# Patient Record
Sex: Male | Born: 1980 | Race: Black or African American | Hispanic: No | Marital: Single | Smoking: Current every day smoker
Health system: Southern US, Community
[De-identification: ages and names within clinical notes are randomized; demographics above are authoritative.]

## PROBLEM LIST (undated history)

## (undated) DIAGNOSIS — R569 Unspecified convulsions: Secondary | ICD-10-CM

---

## 2015-08-24 ENCOUNTER — Emergency Department (HOSPITAL_BASED_OUTPATIENT_CLINIC_OR_DEPARTMENT_OTHER)
Admission: EM | Admit: 2015-08-24 | Discharge: 2015-08-25 | Disposition: A | Discharge status: Home / Self Care | Payer: Medicaid Other | Attending: Emergency Medicine | Admitting: Emergency Medicine

## 2015-08-24 ENCOUNTER — Encounter (HOSPITAL_BASED_OUTPATIENT_CLINIC_OR_DEPARTMENT_OTHER): Payer: Self-pay

## 2015-08-24 DIAGNOSIS — L0291 Cutaneous abscess, unspecified: Secondary | ICD-10-CM

## 2015-08-24 DIAGNOSIS — L0201 Cutaneous abscess of face: Secondary | ICD-10-CM | POA: Insufficient documentation

## 2015-08-24 DIAGNOSIS — L0390 Cellulitis, unspecified: Secondary | ICD-10-CM

## 2015-08-24 DIAGNOSIS — L03211 Cellulitis of face: Secondary | ICD-10-CM | POA: Insufficient documentation

## 2015-08-24 HISTORY — DX: Unspecified convulsions: R56.9

## 2015-08-24 MED ORDER — LIDOCAINE HCL 2 % IJ SOLN
INTRAMUSCULAR | Status: AC
  Filled 2015-08-24: qty 20

## 2015-08-24 NOTE — ED Notes (Signed)
Pt c/o right side jaw abscess for the last three days with lower back pain

## 2015-08-25 MED ORDER — SULFAMETHOXAZOLE-TRIMETHOPRIM 800-160 MG PO TABS
1.0000 | ORAL_TABLET | Freq: Once | ORAL | Status: AC
  Administered 2015-08-25: 1 via ORAL

## 2015-08-25 MED ORDER — SULFAMETHOXAZOLE-TRIMETHOPRIM 800-160 MG PO TABS
ORAL_TABLET | ORAL | Status: AC
  Filled 2015-08-25: qty 1

## 2015-08-25 MED ORDER — HYDROCODONE-ACETAMINOPHEN 5-325 MG PO TABS
2.0000 | ORAL_TABLET | Freq: Once | ORAL | Status: AC
  Administered 2015-08-25: 2 via ORAL
  Filled 2015-08-25: qty 2

## 2015-08-25 MED ORDER — HYDROCODONE-ACETAMINOPHEN 5-325 MG PO TABS: 1.0000 | ORAL_TABLET | Freq: Four times a day (QID) | ORAL | Status: DC | PRN

## 2015-08-25 MED ORDER — SULFAMETHOXAZOLE-TRIMETHOPRIM 800-160 MG PO TABS
1.0000 | ORAL_TABLET | Freq: Two times a day (BID) | ORAL | Status: AC
Start: 2015-08-25 — End: 2015-09-01

## 2015-08-25 NOTE — ED Notes (Signed)
Pt verbalizes understanding of d/c instructions and denies any further needs at this time. 

## 2015-08-25 NOTE — ED Provider Notes (Signed)
TIME SEEN: 12:45 AM  CHIEF COMPLAINT: Right-sided facial abscess  HPI: Pt is a 35 y.o. male with history of seizures who presents to the emergency department with right-sided abscess at the angle of his jaw for the past 3 days. States it started off as a small "temple" that was draining. It is now no longer draining and has grown in size and is painful. No fevers or chills. No nausea, vomiting or diarrhea. He is not diabetic or immunocompromised. Has never had an abscess before. No dental pain. No swelling underneath his jaw.  ROS: See HPI Constitutional: no fever  Eyes: no drainage  ENT: no runny nose   Cardiovascular:  no chest pain  Resp: no SOB  GI: no vomiting GU: no dysuria Integumentary: no rash  Allergy: no hives  Musculoskeletal: no leg swelling  Neurological: no slurred speech ROS otherwise negative  PAST MEDICAL HISTORY/PAST SURGICAL HISTORY:  Past Medical History  Diagnosis Date  . Seizures (HCC)     MEDICATIONS:  Prior to Admission medications   Medication Sig Start Date End Date Taking? Authorizing Provider  UNKNOWN TO PATIENT    Yes Historical Provider, MD    ALLERGIES:  No Known Allergies  SOCIAL HISTORY:  Social History  Substance Use Topics  . Smoking status: Not on file  . Smokeless tobacco: Not on file  . Alcohol Use: Not on file    FAMILY HISTORY: No family history on file.  EXAM: BP 133/61 mmHg  Pulse 86  Temp(Src) 98.6 F (37 C) (Oral)  Resp 18  Ht  (1.753 m)  Wt 210 lb (95.255 kg)  BMI 31.00 kg/m2  SpO2 100% CONSTITUTIONAL: Alert and oriented and responds appropriately to questions. Well-appearing; well-nourished, afebrile nontoxic HEAD: Normocephalic EYES: Conjunctivae clear, PERRL ENT: normal nose; no rhinorrhea; moist mucous membranes; No pharyngeal erythema or petechiae, no tonsillar hypertrophy or exudate, no uvular deviation, no trismus or drooling, normal phonation, no stridor, no dental caries or abscess noted, no  Ludwig's angina, tongue sits flat in the bottom of the mouth; patient has a 3 x 4 cm swollen slightly fluctuant and indurated area with erythema to the angle of the right mandible with no active drainage NECK: Supple, no meningismus, no LAD  CARD: RRR; S1 and S2 appreciated; no murmurs, no clicks, no rubs, no gallops RESP: Normal chest excursion without splinting or tachypnea; breath sounds clear and equal bilaterally; no wheezes, no rhonchi, no rales, no hypoxia or respiratory distress, speaking full sentences ABD/GI: Normal bowel sounds; non-distended; soft, non-tender, no rebound, no guarding, no peritoneal signs BACK:  The back appears normal and is non-tender to palpation, there is no CVA tenderness EXT: Normal ROM in all joints; non-tender to palpation; no edema; normal capillary refill; no cyanosis, no calf tenderness or swelling    SKIN: Normal color for age and race; warm; no rash NEURO: Moves all extremities equally, sensation to light touch intact diffusely, cranial nerves II through XII intact PSYCH: The patient's mood and manner are appropriate. Grooming and personal hygiene are appropriate.  MEDICAL DECISION MAKING: Patient here with an abscess to the right jaw. I have was able to incise and drain purulent material from this abscess. Given he does have some signs of cellulitis will discharge on Bactrim. We'll discharge with pain medication. No sign of Ludwig's angina. Otherwise well-appearing, nontoxic. He is not immunocompromised. Discussed return precautions. Patient verbalizes understanding and is comfortable with this plan.     INCISION AND DRAINAGE Performed by: Rochele Raring  N Consent: Verbal consent obtained. Risks and benefits: risks, benefits and alternatives were discussed Type: abscess  Body area: Right face  Anesthesia: local infiltration  Incision was made with a scalpel.  Local anesthetic: lidocaine 1 % without epinephrine  Anesthetic total: 5  ml  Complexity: complex Blunt dissection to break up loculations  Drainage: purulent  Drainage amount: Small   Packing material: None   Patient tolerance: Patient tolerated the procedure well with no immediate complications.     Layla Maw Zeinab Rodwell, DO 08/25/15 [phone]

## 2015-08-25 NOTE — Discharge Instructions (Signed)
Cellulitis °Cellulitis is an infection of the skin and the tissue beneath it. The infected area is usually red and tender. Cellulitis occurs most often in the arms and lower legs.  °CAUSES  °Cellulitis is caused by bacteria that enter the skin through cracks or cuts in the skin. The most common types of bacteria that cause cellulitis are staphylococci and streptococci. °SIGNS AND SYMPTOMS  °· Redness and warmth. °· Swelling. °· Tenderness or pain. °· Fever. °DIAGNOSIS  °Your health care provider can usually determine what is wrong based on a physical exam. Blood tests may also be done. °TREATMENT  °Treatment usually involves taking an antibiotic medicine. °HOME CARE INSTRUCTIONS  °· Take your antibiotic medicine as directed by your health care provider. Finish the antibiotic even if you start to feel better. °· Keep the infected arm or leg elevated to reduce swelling. °· Apply a warm cloth to the affected area up to 4 times per day to relieve pain. °· Take medicines only as directed by your health care provider. °· Keep all follow-up visits as directed by your health care provider. °SEEK MEDICAL CARE IF:  °· You notice red streaks coming from the infected area. °· Your red area gets larger or turns dark in color. °· Your bone or joint underneath the infected area becomes painful after the skin has healed. °· Your infection returns in the same area or another area. °· You notice a swollen bump in the infected area. °· You develop new symptoms. °· You have a fever. °SEEK IMMEDIATE MEDICAL CARE IF:  °· You feel very sleepy. °· You develop vomiting or diarrhea. °· You have a general ill feeling (malaise) with muscle aches and pains. °  °This information is not intended to replace advice given to you by your health care provider. Make sure you discuss any questions you have with your health care provider. °  °Document Released: 03/22/2005 Document Revised: 03/03/2015 Document Reviewed: 08/28/2011 °Elsevier Interactive  Patient Education ©2016 [employer] ° °Abscess °An abscess is an infected area that contains a collection of pus and debris. It can occur in almost any part of the body. An abscess is also known as a furuncle or boil. °CAUSES  °An abscess occurs when tissue gets infected. This can occur from blockage of oil or sweat glands, infection of hair follicles, or a minor injury to the skin. As the body tries to fight the infection, pus collects in the area and creates pressure under the skin. This pressure causes pain. People with weakened immune systems have difficulty fighting infections and get certain abscesses more often.  °SYMPTOMS °Usually an abscess develops on the skin and becomes a painful mass that is red, warm, and tender. If the abscess forms under the skin, you may feel a moveable soft area under the skin. Some abscesses break open (rupture) on their own, but most will continue to get worse without care. The infection can spread deeper into the body and eventually into the bloodstream, causing you to feel ill.  °DIAGNOSIS  °Your caregiver will take your medical history and perform a physical exam. A sample of fluid may also be taken from the abscess to determine what is causing your infection. °TREATMENT  °Your caregiver may prescribe antibiotic medicines to fight the infection. However, taking antibiotics alone usually does not cure an abscess. Your caregiver may need to make a small cut (incision) in the abscess to drain the pus. In some cases, gauze is packed into the abscess to reduce   pain and to continue draining the area. °HOME CARE INSTRUCTIONS  °· Only take over-the-counter or prescription medicines for pain, discomfort, or fever as directed by your caregiver. °· If you were prescribed antibiotics, take them as directed. Finish them even if you start to feel better. °· If gauze is used, follow your caregiver's directions for changing the gauze. °· To avoid spreading the infection: °¨ Keep your  draining abscess covered with a bandage. °¨ Wash your hands well. °¨ Do not share personal care items, towels, or whirlpools with others. °¨ Avoid skin contact with others. °· Keep your skin and clothes clean around the abscess. °· Keep all follow-up appointments as directed by your caregiver. °SEEK MEDICAL CARE IF:  °· You have increased pain, swelling, redness, fluid drainage, or bleeding. °· You have muscle aches, chills, or a general ill feeling. °· You have a fever. °MAKE SURE YOU:  °· Understand these instructions. °· Will watch your condition. °· Will get help right away if you are not doing well or get worse. °  °This information is not intended to replace advice given to you by your health care provider. Make sure you discuss any questions you have with your health care provider. °  °Document Released: 03/22/2005 Document Revised: 12/12/2011 Document Reviewed: 08/25/2011 °Elsevier Interactive Patient Education ©2016 [employer] ° °

## 2016-03-27 ENCOUNTER — Emergency Department (HOSPITAL_BASED_OUTPATIENT_CLINIC_OR_DEPARTMENT_OTHER)
Admission: EM | Admit: 2016-03-27 | Discharge: 2016-03-28 | Disposition: A | Discharge status: Home / Self Care | Payer: Medicaid Other | Attending: Emergency Medicine | Admitting: Emergency Medicine

## 2016-03-27 ENCOUNTER — Encounter (HOSPITAL_BASED_OUTPATIENT_CLINIC_OR_DEPARTMENT_OTHER): Payer: Self-pay | Admitting: Emergency Medicine

## 2016-03-27 DIAGNOSIS — S0502XA Injury of conjunctiva and corneal abrasion without foreign body, left eye, initial encounter: Secondary | ICD-10-CM | POA: Insufficient documentation

## 2016-03-27 DIAGNOSIS — Y9389 Activity, other specified: Secondary | ICD-10-CM | POA: Insufficient documentation

## 2016-03-27 DIAGNOSIS — Y990 Civilian activity done for income or pay: Secondary | ICD-10-CM | POA: Diagnosis not present

## 2016-03-27 DIAGNOSIS — X58XXXA Exposure to other specified factors, initial encounter: Secondary | ICD-10-CM | POA: Diagnosis not present

## 2016-03-27 DIAGNOSIS — S0592XA Unspecified injury of left eye and orbit, initial encounter: Secondary | ICD-10-CM | POA: Diagnosis present

## 2016-03-27 DIAGNOSIS — Y929 Unspecified place or not applicable: Secondary | ICD-10-CM | POA: Insufficient documentation

## 2016-03-27 DIAGNOSIS — F17200 Nicotine dependence, unspecified, uncomplicated: Secondary | ICD-10-CM | POA: Diagnosis not present

## 2016-03-27 DIAGNOSIS — Z79899 Other long term (current) drug therapy: Secondary | ICD-10-CM | POA: Insufficient documentation

## 2016-03-27 NOTE — ED Triage Notes (Signed)
Left eye pain x 2 days

## 2016-03-28 ENCOUNTER — Encounter (HOSPITAL_BASED_OUTPATIENT_CLINIC_OR_DEPARTMENT_OTHER): Payer: Self-pay | Admitting: Emergency Medicine

## 2016-03-28 MED ORDER — TETRACAINE HCL 0.5 % OP SOLN
OPHTHALMIC | Status: AC
  Administered 2016-03-28: 01:00:00
  Filled 2016-03-28: qty 4

## 2016-03-28 MED ORDER — IBUPROFEN 800 MG PO TABS
800.0000 mg | ORAL_TABLET | Freq: Once | ORAL | Status: AC
  Administered 2016-03-28: 800 mg via ORAL
  Filled 2016-03-28: qty 1

## 2016-03-28 MED ORDER — FLUORESCEIN SODIUM 1 MG OP STRP
ORAL_STRIP | OPHTHALMIC | Status: AC
  Administered 2016-03-28: 01:00:00
  Filled 2016-03-28: qty 1

## 2016-03-28 MED ORDER — TOBRAMYCIN-DEXAMETHASONE 0.3-0.1 % OP SUSP: 2.0000 [drp] | Freq: Four times a day (QID) | OPHTHALMIC | 0 refills | Status: DC

## 2016-03-28 MED ORDER — IBUPROFEN 800 MG PO TABS: 800.0000 mg | ORAL_TABLET | Freq: Three times a day (TID) | ORAL | 0 refills | Status: DC

## 2016-03-28 NOTE — ED Provider Notes (Signed)
[facility] Note   CSN: [id number] Arrival date & time: 03/27/16  2304   By signing my name below, I, Teofilo Pod, attest that this documentation has been prepared under the direction and in the presence of Nawaf Strange, MD . Electronically Signed: Teofilo Pod, ED Scribe. 03/28/2016. 12:31 AM.   History   Chief Complaint Chief Complaint  Patient presents with  . Eye Pain    The history is provided by the patient. No language interpreter was used.  Eye Pain  This is a new problem. The current episode started 2 days ago. The problem occurs constantly. The problem has not changed since onset.Pertinent negatives include no chest pain, no abdominal pain, no headaches and no shortness of breath. Nothing aggravates the symptoms. Nothing relieves the symptoms. He has tried water for the symptoms. The treatment provided no (lead to blurred vision) relief.   HPI Comments:  Nathan Maxwell is a 35 y.o. male who presents to the Emergency Department complaining of constant L eye pain x2 days. Pt states that he was working and some wood debris flew in to his L eye, was not wearing glasses. Pt reports some associated tears. Pt rinsed his eye with water which led to blurred vision. Last tetanus was 3 years ago. Pt denies redness.   Past Medical History:  Diagnosis Date  . Seizures (HCC)     There are no active problems to display for this patient.   History reviewed. No pertinent surgical history.     Home Medications    Prior to Admission medications   Medication Sig Start Date End Date Taking? Authorizing Provider  carbamazepine (TEGRETOL) 200 MG tablet Take 200 mg by mouth 3 (three) times daily.   Yes Historical Provider, MD  HYDROcodone-acetaminophen (NORCO/VICODIN) 5-325 MG tablet Take 1-2 tablets by mouth every 6 (six) hours as needed. 08/25/15   Kristen N Ward, DO  UNKNOWN TO PATIENT     Historical Provider, MD    Family History History reviewed.  No pertinent family history.  Social History Social History  Substance Use Topics  . Smoking status: Current Every Day Smoker  . Smokeless tobacco: Never Used  . Alcohol use Yes     Comment: occ     Allergies   Review of patient's allergies indicates no known allergies.   Review of Systems Review of Systems  Constitutional: Negative for fever.  Eyes: Positive for pain. Negative for photophobia, discharge, redness, itching and visual disturbance.  Respiratory: Negative for shortness of breath.   Cardiovascular: Negative for chest pain.  Gastrointestinal: Negative for abdominal pain.  Neurological: Negative for headaches.  All other systems reviewed and are negative.    Physical Exam Updated Vital Signs BP 120/64 (BP Location: Right Arm)   Pulse 68   Temp 98.3 F (36.8 C) (Oral)   Resp 18   Ht 5\' 10"  (1.778 m)   Wt 210 lb (95.3 kg)   SpO2 100%   BMI 30.13 kg/m   Physical Exam  Constitutional: He is oriented to person, place, and time. He appears well-developed and well-nourished. No distress.  HENT:  Head: Normocephalic and atraumatic.  Mouth/Throat: Oropharynx is clear and moist. No oropharyngeal exudate.  Trachea midline  Eyes: Conjunctivae, EOM and lids are normal. Pupils are equal, round, and reactive to light. Lids are everted and swept, no foreign bodies found. Right eye exhibits no chemosis, no discharge and no exudate. No foreign body present in the right eye. Left eye exhibits  no chemosis, no discharge and no exudate. No foreign body present in the left eye. Right conjunctiva is not injected. Left conjunctiva is not injected. Right eye exhibits normal extraocular motion and no nystagmus. Left eye exhibits normal extraocular motion and no nystagmus.  Slit lamp exam:      The left eye shows corneal abrasion (9 to 12 oclock position). The left eye shows no corneal flare, no corneal ulcer, no foreign body, no hyphema, no hypopyon and no anterior chamber bulge.  No  leakage. Lids inverted and swept.   Neck: Trachea normal and normal range of motion. Neck supple. No JVD present. Carotid bruit is not present. No tracheal deviation present.  No bruits  Cardiovascular: Normal rate, regular rhythm, normal heart sounds and intact distal pulses.  Exam reveals no gallop and no friction rub.   No murmur heard. Pulmonary/Chest: Effort normal and breath sounds normal. No stridor. He has no wheezes. He has no rales.  Abdominal: Soft. Bowel sounds are normal. He exhibits no mass. There is no tenderness. There is no rebound and no guarding.  Musculoskeletal: Normal range of motion.  Lymphadenopathy:    He has no cervical adenopathy.  Neurological: He is alert and oriented to person, place, and time. He has normal reflexes. He displays normal reflexes. No cranial nerve deficit. He exhibits normal muscle tone. Coordination normal.  Skin: Skin is warm and dry. Capillary refill takes less than 2 seconds. He is not diaphoretic.  Psychiatric: He has a normal mood and affect. His behavior is normal.    ED Treatments / Results  DIAGNOSTIC STUDIES:  Oxygen Saturation is 100% on RA, normal by my interpretation.    COORDINATION OF CARE:  12:31 AM Discussed treatment plan with pt at bedside and pt agreed to plan.  Vitals:   03/27/16 2313  BP: 120/64  Pulse: 68  Resp: 18  Temp: 98.3 F (36.8 C)   Medications  fluorescein 1 MG ophthalmic strip (not administered)  tetracaine (PONTOCAINE) 0.5 % ophthalmic solution (not administered)  ibuprofen (ADVIL,MOTRIN) tablet 800 mg (not administered)     Visual Acuity  Right Eye Distance: 20/50 Left Eye Distance: 20/25 Bilateral Distance: 20/25  Right Eye Near:   Left Eye Near:    Bilateral Near:    Radiology No results found.  Procedures Procedures (including critical care time)  Medications Ordered in ED Medications  fluorescein 1 MG ophthalmic strip (not administered)  tetracaine (PONTOCAINE) 0.5 % ophthalmic  solution (not administered)  ibuprofen (ADVIL,MOTRIN) tablet 800 mg (not administered)     Initial Impression / Assessment and Plan / ED Course  I have reviewed the triage vital signs and the nursing notes.  Pertinent labs & imaging results that were available during my care of the patient were reviewed by me and considered in my medical decision making (see chart for details).  Clinical Course    Final Clinical Impressions(s) / ED Diagnoses   Final diagnoses:  None   Antibiotic drops.  Follow up with ophthalmology in 2 days.  Strict return precautions given.  All questions answered to patient's satisfaction. Based on history and exam patient has been appropriately medically screened and emergency conditions excluded. Patient is stable for discharge at this time. Follow up with your PMD for recheck in 2 days and strict return precautions given New Prescriptions New Prescriptions   No medications on file  I personally performed the services described in this documentation, which was scribed in my presence. The recorded information has been reviewed  and is accurate.       Cy BlamerApril Forrester Blando, MD 03/28/16 [phone]

## 2017-07-11 ENCOUNTER — Emergency Department (HOSPITAL_BASED_OUTPATIENT_CLINIC_OR_DEPARTMENT_OTHER)
Admission: EM | Admit: 2017-07-11 | Discharge: 2017-07-12 | Disposition: A | Discharge status: Home / Self Care | Payer: Medicaid Other | Attending: Emergency Medicine | Admitting: Emergency Medicine

## 2017-07-11 ENCOUNTER — Emergency Department (HOSPITAL_BASED_OUTPATIENT_CLINIC_OR_DEPARTMENT_OTHER): Payer: Medicaid Other

## 2017-07-11 ENCOUNTER — Other Ambulatory Visit: Payer: Self-pay

## 2017-07-11 ENCOUNTER — Encounter (HOSPITAL_BASED_OUTPATIENT_CLINIC_OR_DEPARTMENT_OTHER): Payer: Self-pay

## 2017-07-11 DIAGNOSIS — M25531 Pain in right wrist: Secondary | ICD-10-CM | POA: Insufficient documentation

## 2017-07-11 DIAGNOSIS — M791 Myalgia, unspecified site: Secondary | ICD-10-CM | POA: Diagnosis not present

## 2017-07-11 DIAGNOSIS — Y998 Other external cause status: Secondary | ICD-10-CM | POA: Diagnosis not present

## 2017-07-11 DIAGNOSIS — Y939 Activity, unspecified: Secondary | ICD-10-CM | POA: Insufficient documentation

## 2017-07-11 DIAGNOSIS — F17210 Nicotine dependence, cigarettes, uncomplicated: Secondary | ICD-10-CM | POA: Insufficient documentation

## 2017-07-11 DIAGNOSIS — Y92410 Unspecified street and highway as the place of occurrence of the external cause: Secondary | ICD-10-CM | POA: Insufficient documentation

## 2017-07-11 MED ORDER — NAPROXEN 250 MG PO TABS
500.0000 mg | ORAL_TABLET | Freq: Once | ORAL | Status: AC
  Administered 2017-07-11: 500 mg via ORAL
  Filled 2017-07-11: qty 2

## 2017-07-11 MED ORDER — ACETAMINOPHEN 500 MG PO TABS
1000.0000 mg | ORAL_TABLET | Freq: Once | ORAL | Status: AC
  Administered 2017-07-11: 1000 mg via ORAL
  Filled 2017-07-11: qty 2
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
## 2017-07-12 MED ORDER — NAPROXEN 500 MG PO TABS: 500.0000 mg | ORAL_TABLET | Freq: Two times a day (BID) | ORAL | 0 refills | Status: DC | PRN

## 2017-07-12 MED ORDER — METHOCARBAMOL 500 MG PO TABS: 500.0000 mg | ORAL_TABLET | Freq: Two times a day (BID) | ORAL | 0 refills | Status: DC | PRN

## 2017-07-12 NOTE — ED Notes (Signed)
Pt verbalizes understanding of d/c instructions and denies any further needs at this time. 
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
## 2019-10-20 ENCOUNTER — Emergency Department (HOSPITAL_BASED_OUTPATIENT_CLINIC_OR_DEPARTMENT_OTHER): Payer: Medicaid Other

## 2019-10-20 ENCOUNTER — Encounter (HOSPITAL_BASED_OUTPATIENT_CLINIC_OR_DEPARTMENT_OTHER): Payer: Self-pay | Admitting: Emergency Medicine

## 2019-10-20 ENCOUNTER — Emergency Department (HOSPITAL_BASED_OUTPATIENT_CLINIC_OR_DEPARTMENT_OTHER)
Admission: EM | Admit: 2019-10-20 | Discharge: 2019-10-20 | Disposition: A | Discharge status: Home / Self Care | Payer: Medicaid Other | Attending: Emergency Medicine | Admitting: Emergency Medicine

## 2019-10-20 ENCOUNTER — Other Ambulatory Visit: Payer: Self-pay

## 2019-10-20 DIAGNOSIS — Z79899 Other long term (current) drug therapy: Secondary | ICD-10-CM | POA: Insufficient documentation

## 2019-10-20 DIAGNOSIS — F17210 Nicotine dependence, cigarettes, uncomplicated: Secondary | ICD-10-CM | POA: Insufficient documentation

## 2019-10-20 DIAGNOSIS — R109 Unspecified abdominal pain: Secondary | ICD-10-CM | POA: Diagnosis present

## 2019-10-20 DIAGNOSIS — Z791 Long term (current) use of non-steroidal anti-inflammatories (NSAID): Secondary | ICD-10-CM | POA: Insufficient documentation

## 2019-10-20 DIAGNOSIS — R300 Dysuria: Secondary | ICD-10-CM | POA: Diagnosis not present

## 2019-10-20 LAB — CBC
HCT: 46 % (ref 39.0–52.0)
Hemoglobin: 16 g/dL (ref 13.0–17.0)
MCH: 32.3 pg (ref 26.0–34.0)
MCHC: 34.8 g/dL (ref 30.0–36.0)
MCV: 92.9 fL (ref 80.0–100.0)
Platelets: 275 10*3/uL (ref 150–400)
RBC: 4.95 MIL/uL (ref 4.22–5.81)
RDW: 12.4 % (ref 11.5–15.5)
WBC: 6.3 10*3/uL (ref 4.0–10.5)
nRBC: 0 % (ref 0.0–0.2)

## 2019-10-20 LAB — COMPREHENSIVE METABOLIC PANEL
ALT: 15 U/L (ref 0–44)
AST: 21 U/L (ref 15–41)
Albumin: 4.7 g/dL (ref 3.5–5.0)
Alkaline Phosphatase: 73 U/L (ref 38–126)
Anion gap: 10 (ref 5–15)
BUN: 14 mg/dL (ref 6–20)
CO2: 27 mmol/L (ref 22–32)
Calcium: 9.8 mg/dL (ref 8.9–10.3)
Chloride: 102 mmol/L (ref 98–111)
Creatinine, Ser: 1.07 mg/dL (ref 0.61–1.24)
GFR calc Af Amer: >60 mL/min (ref >60)
GFR calc non Af Amer: >60 mL/min (ref >60)
Glucose, Bld: 83 mg/dL (ref 70–99)
Potassium: 3.8 mmol/L (ref 3.5–5.1)
Sodium: 139 mmol/L (ref 135–145)
Total Bilirubin: 0.7 mg/dL (ref 0.3–1.2)
Total Protein: 8.3 g/dL — ABNORMAL HIGH (ref 6.5–8.1)

## 2019-10-20 LAB — URINALYSIS, MICROSCOPIC (REFLEX)

## 2019-10-20 LAB — URINALYSIS, ROUTINE W REFLEX MICROSCOPIC
Bilirubin Urine: NEGATIVE
Glucose, UA: NEGATIVE mg/dL
Ketones, ur: NEGATIVE mg/dL
Nitrite: NEGATIVE
Protein, ur: NEGATIVE mg/dL
Specific Gravity, Urine: 1.02 (ref 1.005–1.030)
pH: 5.5 (ref 5.0–8.0)

## 2019-10-20 LAB — LIPASE, BLOOD: Lipase: 48 U/L (ref 11–51)

## 2019-10-20 MED ORDER — KETOROLAC TROMETHAMINE 30 MG/ML IJ SOLN
30.0000 mg | Freq: Once | INTRAMUSCULAR | Status: AC
  Administered 2019-10-20: 30 mg via INTRAVENOUS
  Filled 2019-10-20: qty 1

## 2019-10-20 MED ORDER — METHOCARBAMOL 500 MG PO TABS: 500.0000 mg | ORAL_TABLET | Freq: Two times a day (BID) | ORAL | 0 refills | Status: AC | PRN

## 2019-10-20 MED ORDER — NAPROXEN 500 MG PO TABS: 500.0000 mg | ORAL_TABLET | Freq: Two times a day (BID) | ORAL | 0 refills | Status: AC | PRN

## 2019-10-20 NOTE — ED Notes (Signed)
ED Provider at bedside. 

## 2019-10-20 NOTE — ED Triage Notes (Signed)
Rt flank pain and hurst to void since yesterday ,  Denies d/c from penis also states hurts to take a deep breath

## 2019-10-20 NOTE — ED Provider Notes (Signed)
[facility] EMERGENCY DEPARTMENT Provider Note   CSN: [id number] Arrival date & time: 10/20/19  1551     History Chief Complaint  Patient presents with  . Flank Pain  . Dysuria    Nathan Maxwell is a 39 y.o. male.  He has a history of seizures.  He is complaining of right lower rib pain right flank pain that started yesterday.  Woke up with it. [place] Spaniel it hurts to take a deep breath and hurts when he twists or turns.  He is also noticed some dysuria.  No fevers chills nausea vomiting.  No prior history of same.  No trauma.  The history is provided by the patient.  Flank Pain This is a new problem. The current episode started yesterday. The problem occurs constantly. The problem has not changed since onset.Associated symptoms include abdominal pain. Pertinent negatives include no chest pain, no headaches and no shortness of breath. The symptoms are aggravated by bending and twisting. Nothing relieves the symptoms. He has tried nothing for the symptoms. The treatment provided no relief.  Dysuria Presenting symptoms: dysuria   Presenting symptoms: no penile discharge and no scrotal pain   Context: after urination   Relieved by:  None tried Worsened by:  Nothing Ineffective treatments:  None tried Associated symptoms: abdominal pain and flank pain   Associated symptoms: no fever, no genital lesions, no nausea, no urinary retention and no vomiting        Past Medical History:  Diagnosis Date  . Seizures (HCC)     There are no problems to display for this patient.   History reviewed. No pertinent surgical history.     No family history on file.  Social History   Tobacco Use  . Smoking status: Current Every Day Smoker    Types: Cigarettes  . Smokeless tobacco: Never Used  Substance Use Topics  . Alcohol use: Yes    Comment: occ  . Drug use: Yes    Types: Marijuana    Home Medications Prior to Admission medications   Medication Sig Start Date End Date  Taking? Authorizing Provider  carbamazepine (TEGRETOL) 200 MG tablet Take 200 mg by mouth 3 (three) times daily.    [provider]  methocarbamol (ROBAXIN) 500 MG tablet Take 1 tablet (500 mg total) by mouth 2 (two) times daily as needed for muscle spasms. 07/12/17   Ward, Chase Picket, PA-C  naproxen (NAPROSYN) 500 MG tablet Take 1 tablet (500 mg total) by mouth 2 (two) times daily as needed. 07/12/17   Ward, Chase Picket, PA-C    Allergies    Patient has no known allergies.  Review of Systems   Review of Systems  Constitutional: Negative for fever.  HENT: Negative for sore throat.   Eyes: Negative for visual disturbance.  Respiratory: Negative for shortness of breath.   Cardiovascular: Negative for chest pain.  Gastrointestinal: Positive for abdominal pain. Negative for nausea and vomiting.  Genitourinary: Positive for dysuria and flank pain. Negative for discharge.  Musculoskeletal: Negative for neck pain.  Skin: Negative for rash.  Neurological: Negative for headaches.    Physical Exam Updated Vital Signs BP 118/76   Pulse 84   Temp 99.1 F (37.3 C) (Oral)   Resp 16   SpO2 100%   Physical Exam Vitals and nursing note reviewed.  Constitutional:      Appearance: He is well-developed.  HENT:     Head: Normocephalic and atraumatic.  Eyes:     Conjunctiva/sclera: Conjunctivae normal.  Cardiovascular:     Rate and Rhythm: Normal rate and regular rhythm.     Heart sounds: No murmur.  Pulmonary:     Effort: Pulmonary effort is normal. No respiratory distress.     Breath sounds: Normal breath sounds.  Abdominal:     Palpations: Abdomen is soft.     Tenderness: There is abdominal tenderness.     Comments: There are some right upper quadrant tenderness but more tender on the right lateral ribs.  Musculoskeletal:        General: No deformity or signs of injury. Normal range of motion.     Cervical back: Neck supple.  Skin:    General: Skin is warm and dry.      Capillary Refill: Capillary refill takes less than 2 seconds.  Neurological:     General: No focal deficit present.     Mental Status: He is alert.     Gait: Gait normal.     ED Results / Procedures / Treatments   Labs (all labs ordered are listed, but only abnormal results are displayed) Labs Reviewed  URINALYSIS, ROUTINE W REFLEX MICROSCOPIC - Abnormal; Notable for the following components:      Result Value   Hgb urine dipstick SMALL (*)    Leukocytes,Ua TRACE (*)    All other components within normal limits  URINALYSIS, MICROSCOPIC (REFLEX) - Abnormal; Notable for the following components:   Bacteria, UA FEW (*)    All other components within normal limits  COMPREHENSIVE METABOLIC PANEL - Abnormal; Notable for the following components:   Total Protein 8.3 (*)    All other components within normal limits  LIPASE, BLOOD  CBC  GC/CHLAMYDIA PROBE AMP ([facility]) NOT AT [facility]    EKG None  Radiology DG Chest 2 View  Result Date: 10/20/2019 CLINICAL DATA:  Pt states RT side pain since last night, hurts to place pressure on RT side, pt denies chest pain, Rt flank pain and pain voiding since yesterday, smoker EXAM: CHEST - 2 VIEW COMPARISON:  None. FINDINGS: The heart size and mediastinal contours are within normal limits. There is mild coarsening of the interstitium diffusely and bilaterally. No focal consolidation. No pneumothorax or pleural effusion. There is no acute finding in the visualized skeleton. IMPRESSION: Bilateral bronchitic changes.  Otherwise no acute finding. Electronically Signed   By: Emmaline Kluver M.D.   On: 10/20/2019 16:39   CT Renal Stone Study  Result Date: 10/20/2019 CLINICAL DATA:  Rt flank pain and hurt to void since yesterday EXAM: CT ABDOMEN AND PELVIS WITHOUT CONTRAST TECHNIQUE: Multidetector CT imaging of the abdomen and pelvis was performed following the standard protocol without IV contrast. COMPARISON:  None. FINDINGS: Lower chest: No acute  abnormality. Evaluation of the abdominal viscera is somewhat limited by the lack of IV contrast. Hepatobiliary: No focal liver abnormality is seen. The gallbladder is unremarkable. Pancreas: Unremarkable. No surrounding inflammatory changes. Spleen: Normal in size without focal abnormality. Adrenals/Urinary Tract: Adrenal glands are unremarkable. Kidneys are symmetric in size. No hydronephrosis. No renal calculi. Urinary bladder is unremarkable. Stomach/Bowel: Stomach is within normal limits. Appendix appears normal. No evidence of bowel wall thickening, distention, or inflammatory changes. Vascular/Lymphatic: No enlarged abdominal or pelvic lymph nodes. Reproductive: Prostate is unremarkable. Other: No abdominal wall hernia or abnormality. No abdominopelvic ascites. Musculoskeletal: No acute or significant osseous findings. IMPRESSION: No acute intra-abdominal or intrapelvic pathology on a noncontrast scan. No renal calculi. Electronically Signed   By: Adline Potter.D.  On: 10/20/2019 18:19    Procedures Procedures (including critical care time)  Medications Ordered in ED Medications  ketorolac (TORADOL) 30 MG/ML injection 30 mg (30 mg Intravenous Given 10/20/19 1811)    ED Course  I have reviewed the triage vital signs and the nursing notes.  Pertinent labs & imaging results that were available during my care of the patient were reviewed by me and considered in my medical decision making (see chart for details).  Clinical Course as of Oct 21 954  Mon Oct 20, 2019  1843 Patient's labs other than a few reds and whites in his urinalysis are unremarkable.  CT also does not show any acute findings.  He does not think he has an STD but he is okay that we test and we can call him if there is anything positive.   [MB]    Clinical Course User Index [MB] Hayden Rasmussen, MD   MDM Rules/Calculators/A&P                     This patient complains of right lateral chest right flank pain; this  involves an extensive number of treatment Options and is a complaint that carries with it a high risk of complications and Morbidity. The differential includes musculoskeletal, biliary colic, renal colic, pyelonephritis  I ordered, reviewed and interpreted labs, which included normal white count normal hemoglobin normal chemistries normal renal function normal LFTs normal lipase, urinalysis equivocal with 6-10 reds 6-10 whites few bacteria.  Have added on GC chlamydia to his urine sample I ordered medication Toradol with some improvement in his pain I ordered imaging studies which included chest x-ray and CT KUB and I independently    visualized and interpreted imaging which showed no acute findings  After the interventions stated above, I reevaluated the patient and found patient to remain very comfortable stable vitals.  His work-up so far has been unremarkable.  I recommended to him that he treat this is muscular and to be aware of any worsening or new symptoms that may help identify the cause of his complaints.  Return instructions discussed  Final Clinical Impression(s) / ED Diagnoses Final diagnoses:  Right flank pain  Dysuria    Rx / DC Orders ED Discharge Orders         Ordered    naproxen (NAPROSYN) 500 MG tablet  2 times daily PRN     10/20/19 1845    methocarbamol (ROBAXIN) 500 MG tablet  2 times daily PRN     10/20/19 1845           Hayden Rasmussen, MD 10/21/19 [phone]

## 2019-10-20 NOTE — Discharge Instructions (Signed)
You were seen in the emergency department for some pain in your right flank and lower chest along with some burning with urination.  You had blood work, urinalysis chest x-ray and a CAT scan of your abdomen and pelvis that did not show any obvious explanation for your symptoms.  We are testing you for gonorrhea and chlamydia and will call you if anything is positive.  We are treating you with some anti-inflammatories and muscle relaxants for your symptoms.  Please return to the emergency department if any worsening or concerning symptoms

## 2020-07-13 ENCOUNTER — Encounter (HOSPITAL_BASED_OUTPATIENT_CLINIC_OR_DEPARTMENT_OTHER): Payer: Self-pay | Admitting: *Deleted

## 2020-07-13 ENCOUNTER — Emergency Department (HOSPITAL_BASED_OUTPATIENT_CLINIC_OR_DEPARTMENT_OTHER)
Admission: EM | Admit: 2020-07-13 | Discharge: 2020-07-13 | Disposition: A | Discharge status: Home / Self Care | Payer: Medicaid Other | Attending: Emergency Medicine | Admitting: Emergency Medicine

## 2020-07-13 ENCOUNTER — Other Ambulatory Visit: Payer: Self-pay

## 2020-07-13 DIAGNOSIS — M545 Low back pain, unspecified: Secondary | ICD-10-CM | POA: Insufficient documentation
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
## 2020-10-18 IMAGING — DX DG CHEST 2V
2 series · 2 of 2 positions shown · non-contrast
Comparison: None.

CLINICAL DATA: Pt states RT side pain since last night, hurts to
place pressure on RT side, pt denies chest pain, Rt flank pain and
pain voiding since yesterday, smoker

EXAM:
CHEST - 2 VIEW

[chest pa]
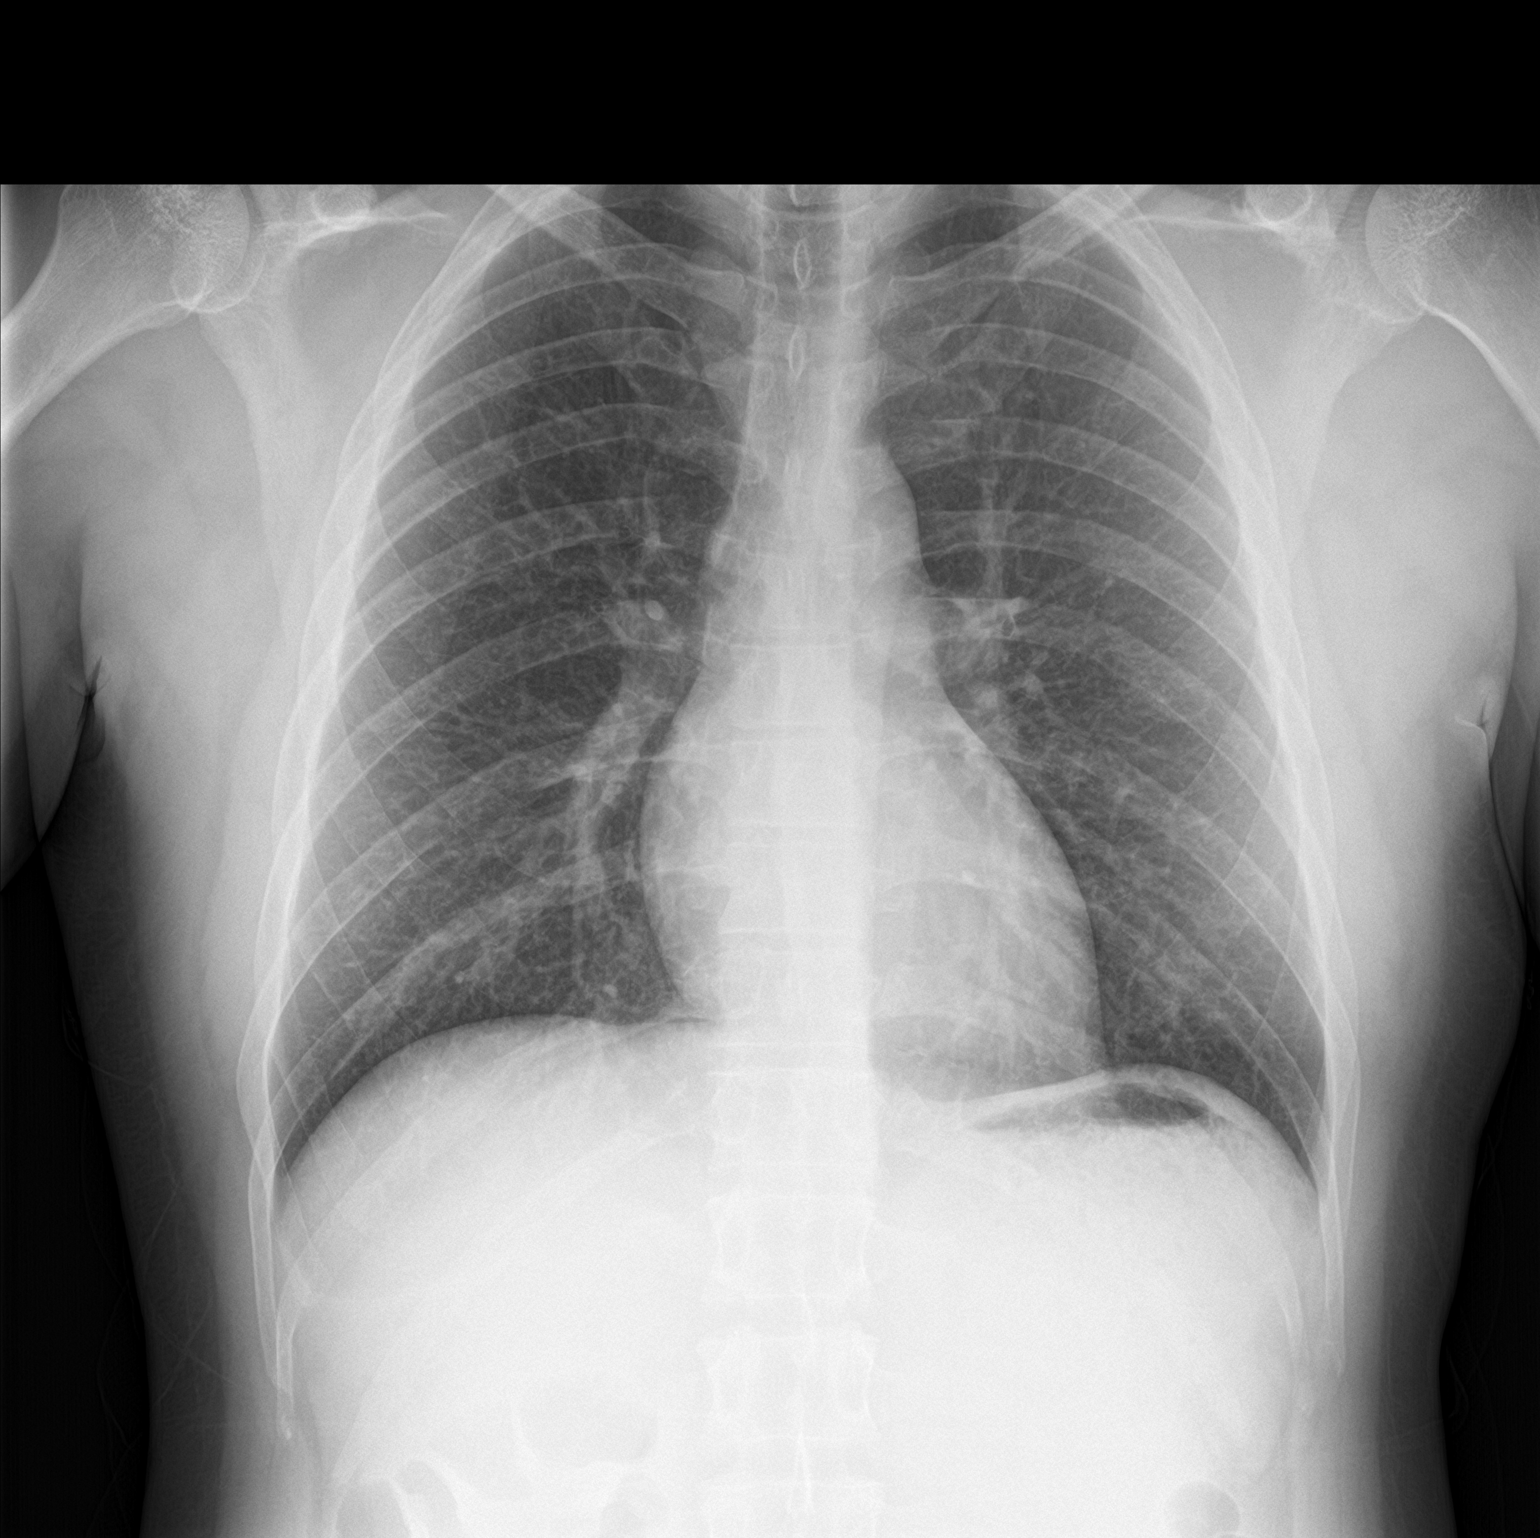

[chest lat]
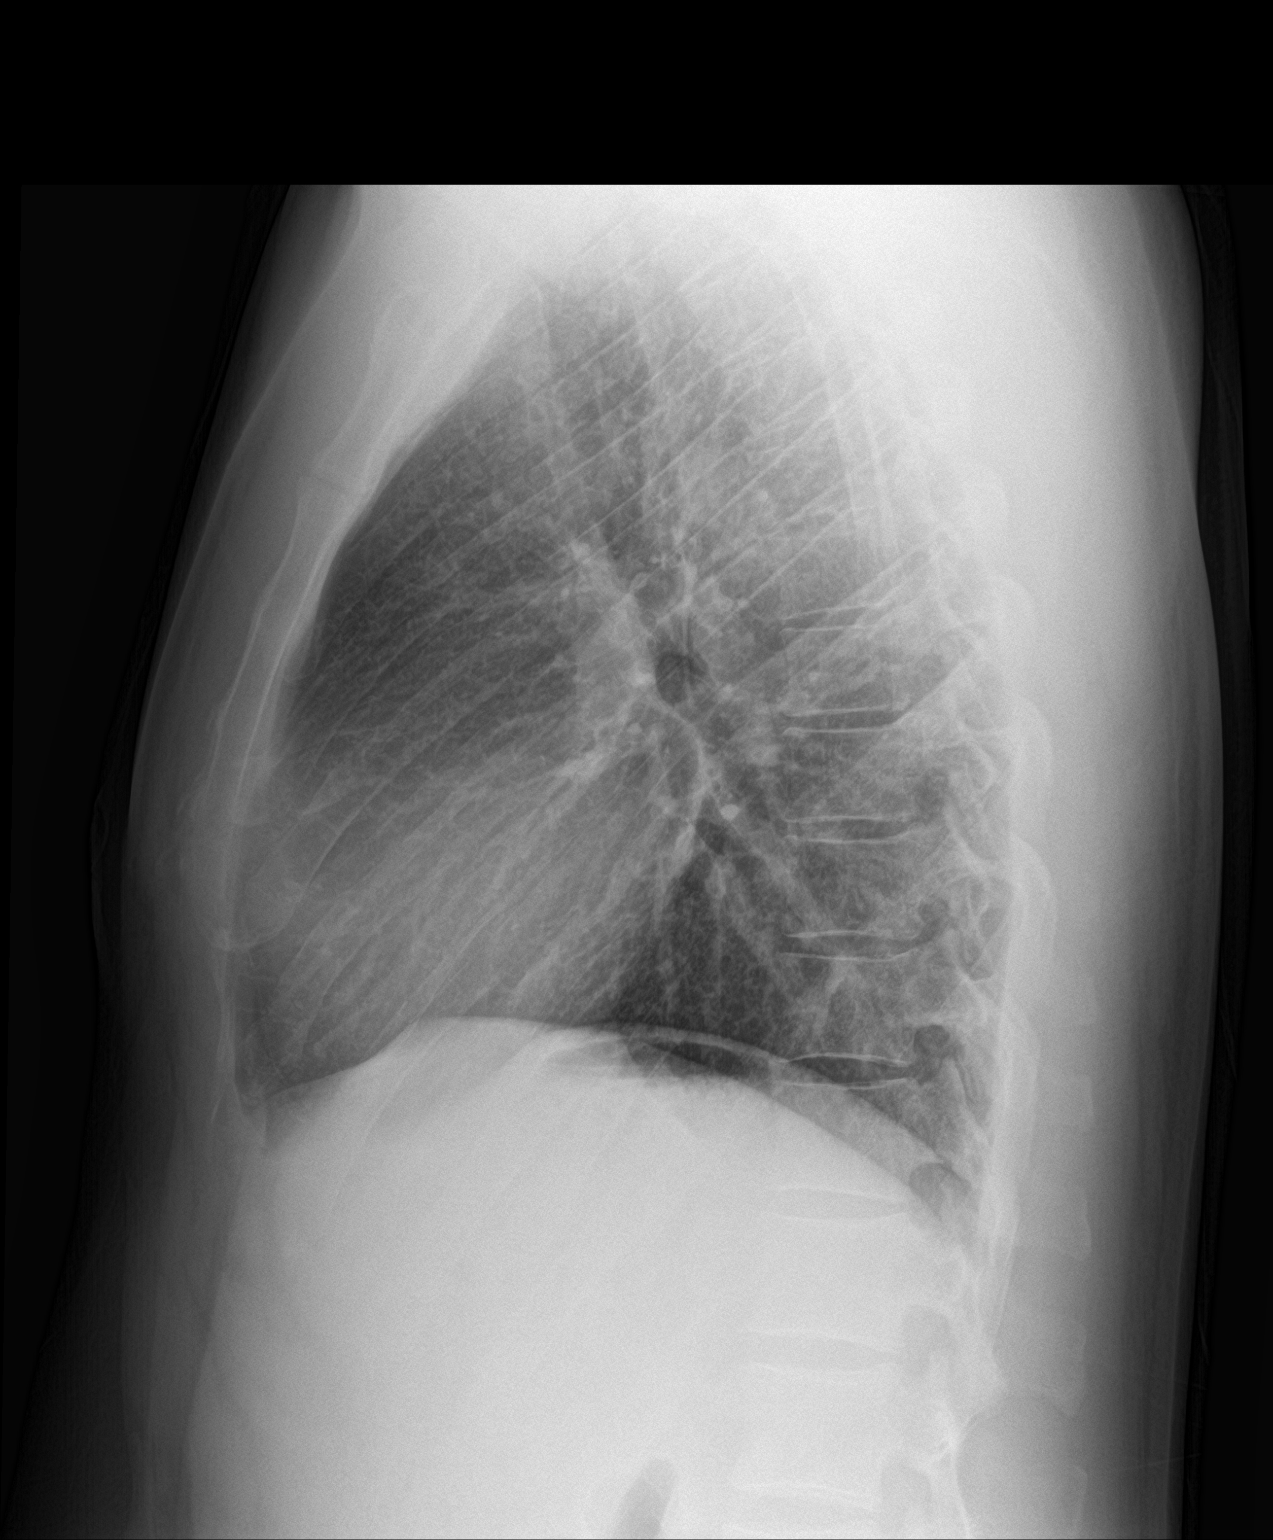

[2 of 2 positions shown; findings below may reference images not displayed]

FINDINGS: The heart size and mediastinal contours are within normal limits.
There is mild coarsening of the interstitium diffusely and
bilaterally. No focal consolidation. No pneumothorax or pleural
effusion. There is no acute finding in the visualized skeleton.
IMPRESSION: Bilateral bronchitic changes.  Otherwise no acute finding.

## 2020-10-18 IMAGING — CT CT RENAL STONE PROTOCOL
2 of 4 series · 17 of 46 positions shown, 19 images · non-contrast
Comparison: None.

CLINICAL DATA: Rt flank pain and hurt to void since yesterday

EXAM:
CT ABDOMEN AND PELVIS WITHOUT CONTRAST
TECHNIQUE: Multidetector CT imaging of the abdomen and pelvis was performed
following the standard protocol without IV contrast.

[Series 2: axial st · axial · 0.66mm/px · z∈[+730,+1125]mm · 14 of 87 slices shown, 16 images]
[im 4/87  soft-tissue]
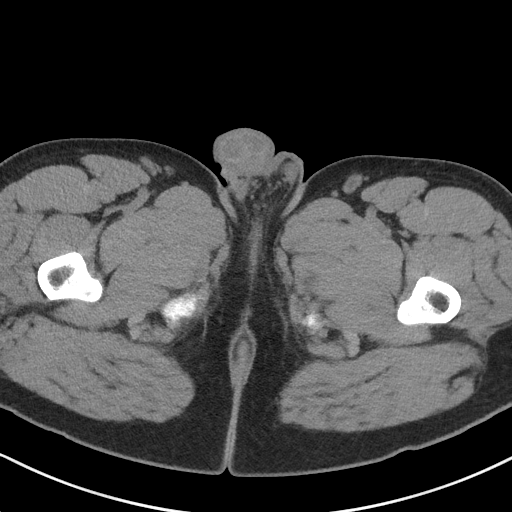
[im 4/87  bone]
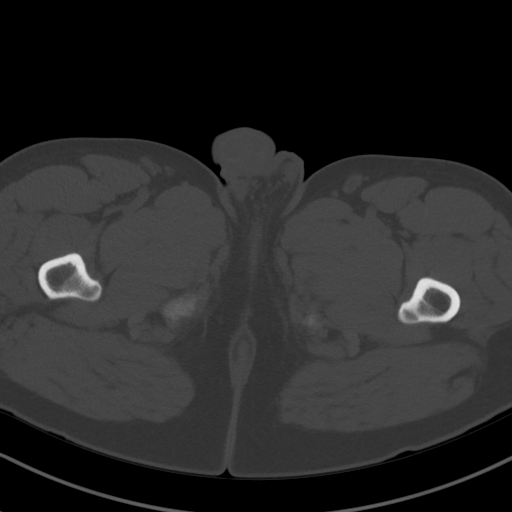
[im 11/87  soft-tissue]
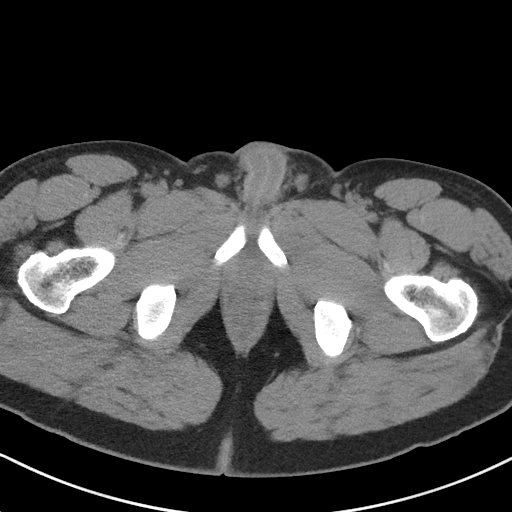
[im 18/87  soft-tissue]
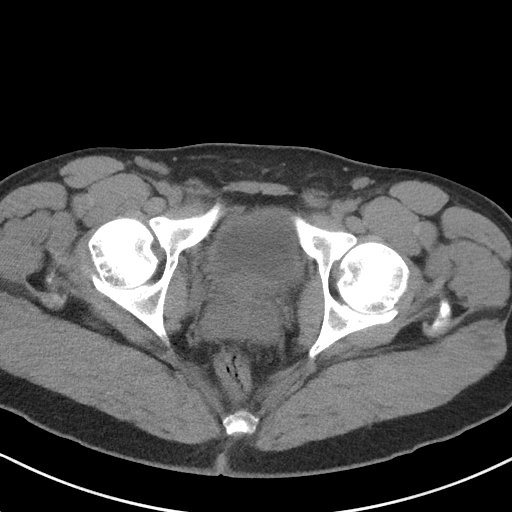
[im 25/87  soft-tissue]
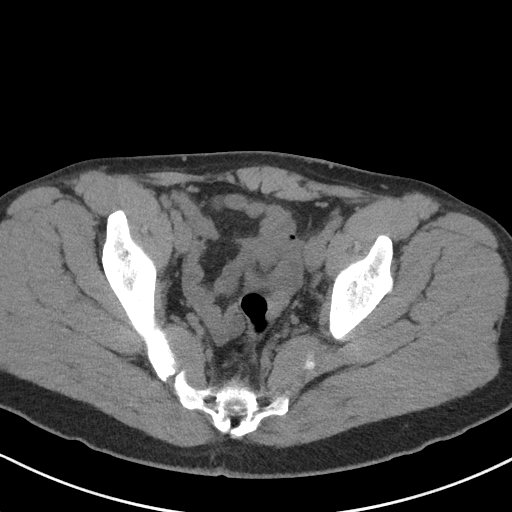
[im 28/87  soft-tissue]
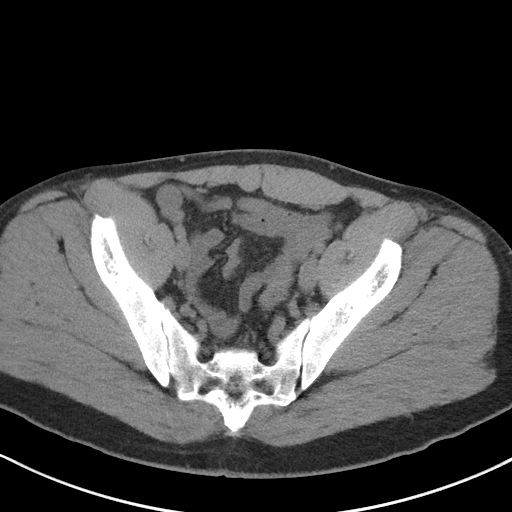
[im 35/87  soft-tissue]
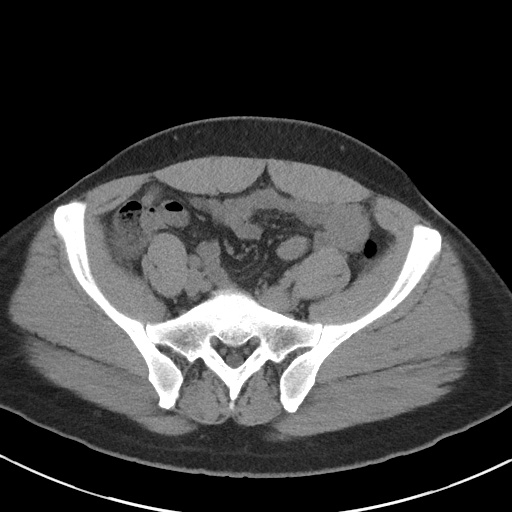
[im 42/87  soft-tissue]
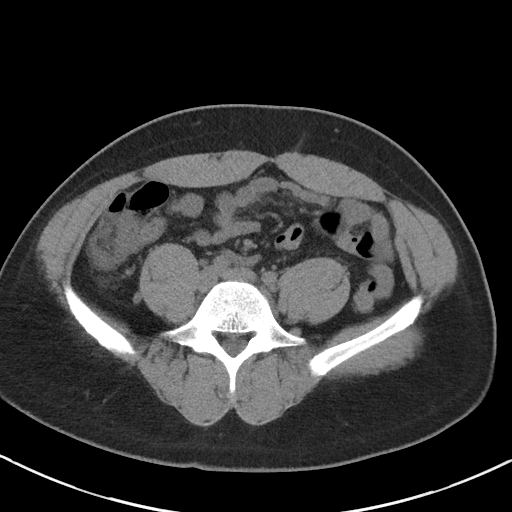
[im 45/87  soft-tissue]
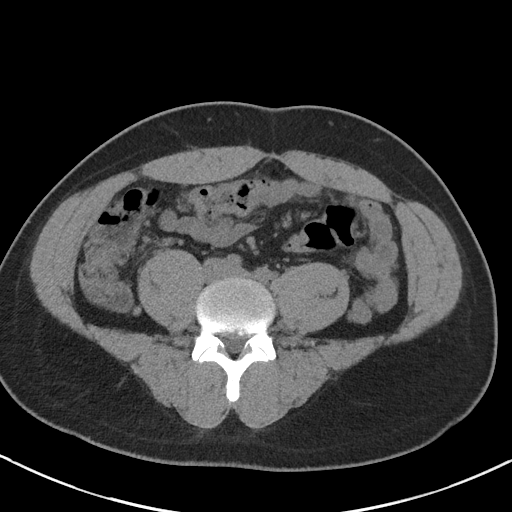
[im 52/87  soft-tissue]
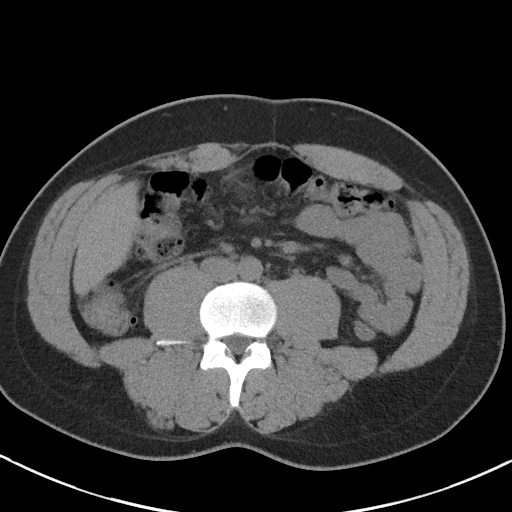
[im 52/87  bone]
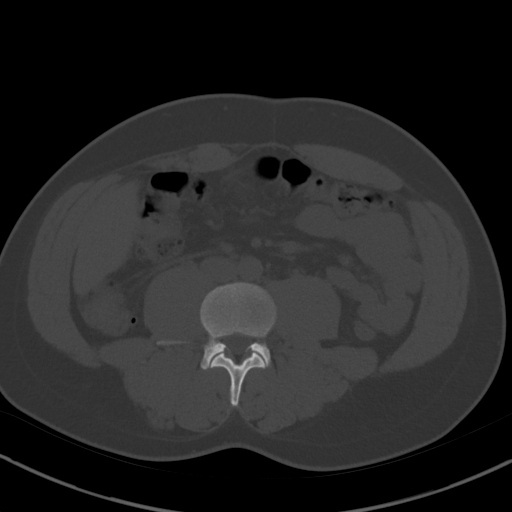
[im 59/87  soft-tissue]
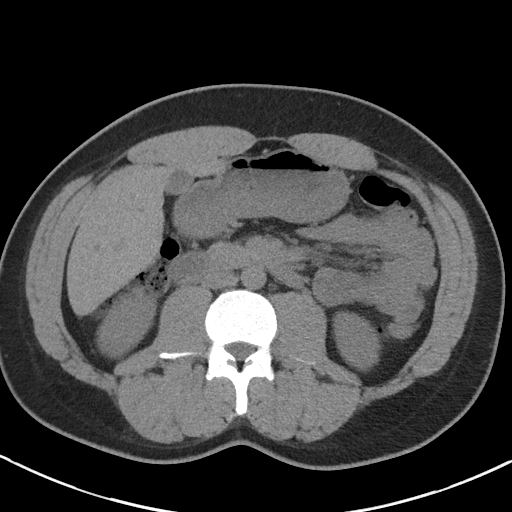
[im 66/87  soft-tissue]
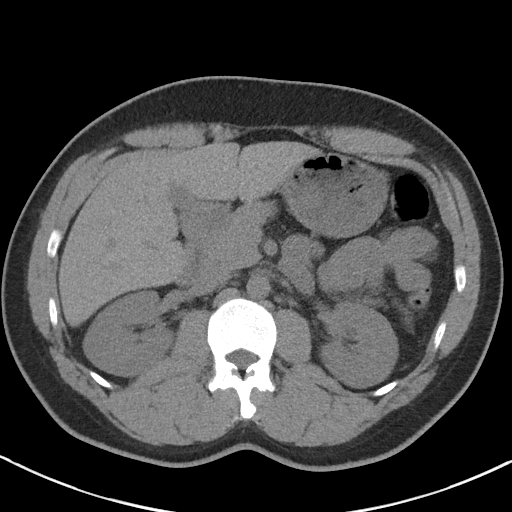
[im 69/87  soft-tissue]
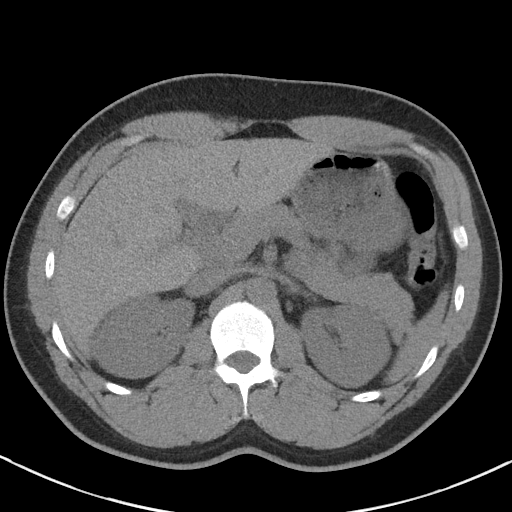
[im 76/87  soft-tissue]
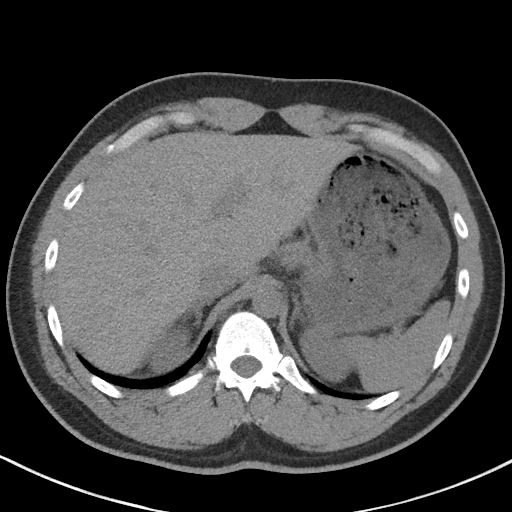
[im 83/87  soft-tissue]
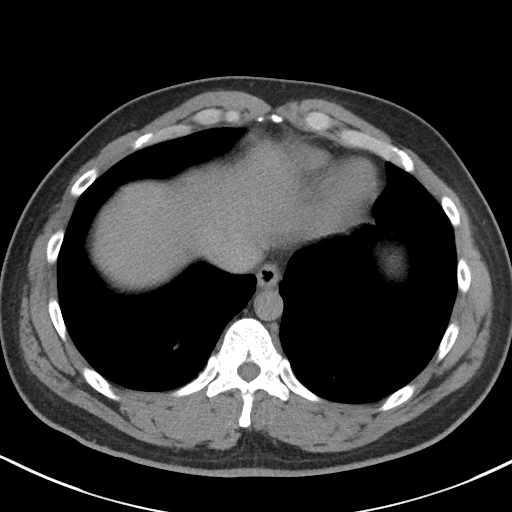

[Series 4: coronal st · coronal · 0.74mm/px · 3 of 100 slices shown]
[im 34/100  soft-tissue]
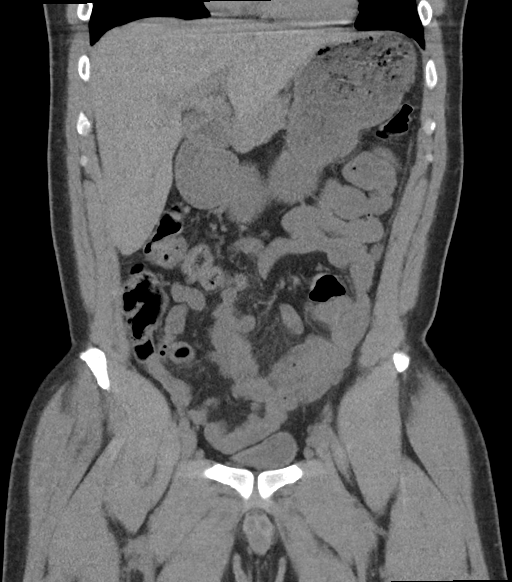
[im 45/100  soft-tissue]
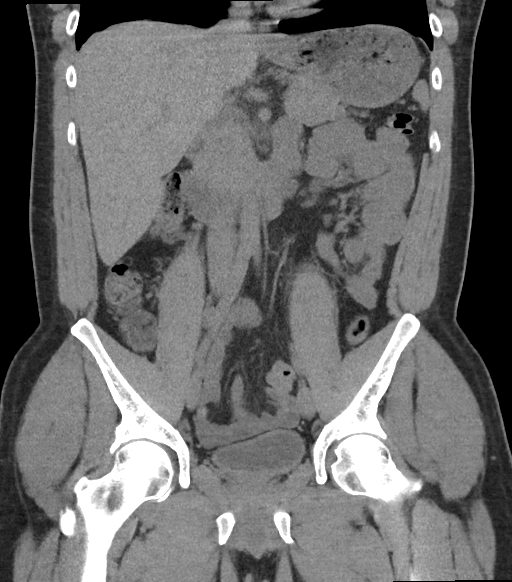
[im 56/100  soft-tissue]
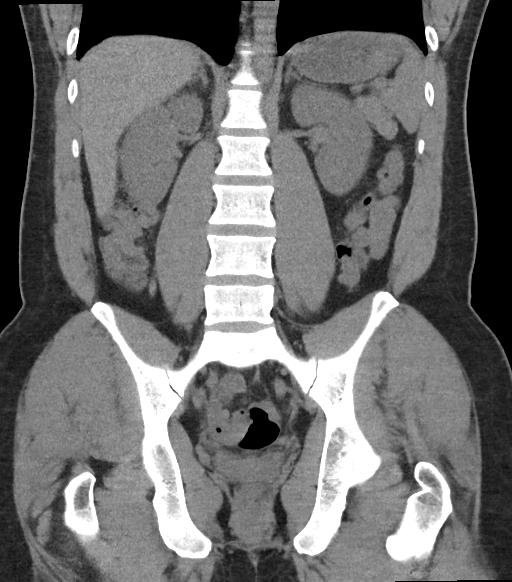

[17 of 46 positions shown; findings below may reference images not displayed]

FINDINGS: Lower chest: No acute abnormality.

Evaluation of the abdominal viscera is somewhat limited by the lack
of IV contrast.

Hepatobiliary: No focal liver abnormality is seen. The gallbladder
is unremarkable.

Pancreas: Unremarkable. No surrounding inflammatory changes.

Spleen: Normal in size without focal abnormality.

Adrenals/Urinary Tract: Adrenal glands are unremarkable. Kidneys are
symmetric in size. No hydronephrosis. No renal calculi. Urinary
bladder is unremarkable.

Stomach/Bowel: Stomach is within normal limits. Appendix appears
normal. No evidence of bowel wall thickening, distention, or
inflammatory changes.

Vascular/Lymphatic: No enlarged abdominal or pelvic lymph nodes.

Reproductive: Prostate is unremarkable.

Other: No abdominal wall hernia or abnormality. No abdominopelvic
ascites.

Musculoskeletal: No acute or significant osseous findings.
IMPRESSION: No acute intra-abdominal or intrapelvic pathology on a noncontrast
scan. No renal calculi.
